# Patient Record
Sex: Male | Born: 1970 | Race: White | Hispanic: No | Marital: Married | Smoking: Current some day smoker
Health system: Southern US, Community
[De-identification: ages and names within clinical notes are randomized; demographics above are authoritative.]

## PROBLEM LIST (undated history)

## (undated) DIAGNOSIS — I728 Aneurysm of other specified arteries: Secondary | ICD-10-CM

## (undated) DIAGNOSIS — I10 Essential (primary) hypertension: Secondary | ICD-10-CM

## (undated) DIAGNOSIS — K219 Gastro-esophageal reflux disease without esophagitis: Secondary | ICD-10-CM

## (undated) DIAGNOSIS — S36039A Unspecified laceration of spleen, initial encounter: Secondary | ICD-10-CM

## (undated) HISTORY — PX: OTHER SURGICAL HISTORY: SHX169

## (undated) HISTORY — PX: KNEE ARTHROSCOPY: SHX127

## (undated) HISTORY — PX: TONSILLECTOMY: SUR1361

---

## 2000-05-24 ENCOUNTER — Emergency Department (HOSPITAL_COMMUNITY): Admission: EM | Admit: 2000-05-24 | Discharge: 2000-05-24 | Payer: Self-pay | Admitting: Emergency Medicine

## 2000-06-02 ENCOUNTER — Other Ambulatory Visit: Admission: RE | Admit: 2000-06-02 | Discharge: 2000-06-02 | Payer: Self-pay | Admitting: Orthopedic Surgery

## 2000-06-20 ENCOUNTER — Encounter (HOSPITAL_COMMUNITY): Admission: RE | Admit: 2000-06-20 | Discharge: 2000-07-20 | Payer: Self-pay | Admitting: Orthopedic Surgery

## 2000-07-25 ENCOUNTER — Encounter (HOSPITAL_COMMUNITY): Admission: RE | Admit: 2000-07-25 | Discharge: 2000-08-24 | Payer: Self-pay | Admitting: Orthopedic Surgery

## 2000-12-28 ENCOUNTER — Encounter: Payer: Self-pay | Admitting: Orthopedic Surgery

## 2000-12-28 ENCOUNTER — Ambulatory Visit (HOSPITAL_COMMUNITY): Admission: RE | Admit: 2000-12-28 | Discharge: 2000-12-28 | Payer: Self-pay | Admitting: Orthopedic Surgery

## 2011-02-14 ENCOUNTER — Ambulatory Visit: Payer: Self-pay

## 2011-11-04 ENCOUNTER — Ambulatory Visit: Payer: Self-pay | Admitting: Orthopedic Surgery

## 2014-01-02 ENCOUNTER — Inpatient Hospital Stay: Payer: Self-pay | Admitting: Internal Medicine

## 2014-01-02 ENCOUNTER — Ambulatory Visit: Payer: Self-pay | Admitting: Internal Medicine

## 2014-01-02 LAB — COMPREHENSIVE METABOLIC PANEL
Albumin: 3.8 g/dL (ref 3.4–5.0)
Alkaline Phosphatase: 121 U/L — ABNORMAL HIGH
Anion Gap: 7 (ref 7–16)
BUN: 7 mg/dL (ref 7–18)
Bilirubin,Total: 1 mg/dL (ref 0.2–1.0)
Calcium, Total: 8.4 mg/dL — ABNORMAL LOW (ref 8.5–10.1)
Chloride: 104 mmol/L (ref 98–107)
Co2: 26 mmol/L (ref 21–32)
Creatinine: 0.99 mg/dL (ref 0.60–1.30)
EGFR (African American): >60
EGFR (Non-African Amer.): >60
Glucose: 107 mg/dL — ABNORMAL HIGH (ref 65–99)
Osmolality: 272 (ref 275–301)
Potassium: 3.4 mmol/L — ABNORMAL LOW (ref 3.5–5.1)
SGOT(AST): 23 U/L (ref 15–37)
SGPT (ALT): 55 U/L
Sodium: 137 mmol/L (ref 136–145)
Total Protein: 7.3 g/dL (ref 6.4–8.2)

## 2014-01-02 LAB — CBC
HCT: 43.7 % (ref 40.0–52.0)
HGB: 14.9 g/dL (ref 13.0–18.0)
MCH: 30.9 pg (ref 26.0–34.0)
MCHC: 34.2 g/dL (ref 32.0–36.0)
MCV: 91 fL (ref 80–100)
Platelet: 217 10*3/uL (ref 150–440)
RBC: 4.82 10*6/uL (ref 4.40–5.90)
RDW: 12.7 % (ref 11.5–14.5)
WBC: 12.9 10*3/uL — ABNORMAL HIGH (ref 3.8–10.6)

## 2014-01-02 LAB — APTT: Activated PTT: 28.2 secs (ref 23.6–35.9)

## 2014-01-02 LAB — LIPASE, BLOOD: Lipase: 155 U/L (ref 73–393)

## 2014-01-02 LAB — URINALYSIS, COMPLETE
Bacteria: NONE SEEN
Bilirubin,UR: NEGATIVE
Blood: NEGATIVE
Glucose,UR: NEGATIVE mg/dL (ref 0–75)
Ketone: NEGATIVE
Leukocyte Esterase: NEGATIVE
Nitrite: NEGATIVE
Ph: 7 (ref 4.5–8.0)
Protein: NEGATIVE
RBC,UR: <1 /HPF (ref 0–5)
Specific Gravity: 1.03 (ref 1.003–1.030)
Squamous Epithelial: NONE SEEN
WBC UR: NONE SEEN /HPF (ref 0–5)

## 2014-01-02 LAB — PROTIME-INR
INR: 1
Prothrombin Time: 12.9 secs (ref 11.5–14.7)

## 2014-01-03 LAB — LIPID PANEL
Cholesterol: 141 mg/dL (ref 0–200)
HDL Cholesterol: 25 mg/dL — ABNORMAL LOW (ref 40–60)
Ldl Cholesterol, Calc: 82 mg/dL (ref 0–100)
Triglycerides: 168 mg/dL (ref 0–200)
VLDL Cholesterol, Calc: 34 mg/dL (ref 5–40)

## 2014-01-03 LAB — CBC WITH DIFFERENTIAL/PLATELET
Basophil #: 0 10*3/uL (ref 0.0–0.1)
Basophil %: 0.2 %
Eosinophil #: 0.1 10*3/uL (ref 0.0–0.7)
Eosinophil %: 1.3 %
HCT: 39.6 % — ABNORMAL LOW (ref 40.0–52.0)
HGB: 13.2 g/dL (ref 13.0–18.0)
Lymphocyte #: 1.6 10*3/uL (ref 1.0–3.6)
Lymphocyte %: 18.3 %
MCH: 30.7 pg (ref 26.0–34.0)
MCHC: 33.3 g/dL (ref 32.0–36.0)
MCV: 92 fL (ref 80–100)
Monocyte #: 0.8 x10 3/mm (ref 0.2–1.0)
Monocyte %: 9.5 %
Neutrophil #: 6.1 10*3/uL (ref 1.4–6.5)
Neutrophil %: 70.7 %
Platelet: 191 10*3/uL (ref 150–440)
RBC: 4.29 10*6/uL — ABNORMAL LOW (ref 4.40–5.90)
RDW: 12.7 % (ref 11.5–14.5)
WBC: 8.7 10*3/uL (ref 3.8–10.6)

## 2014-01-03 LAB — BASIC METABOLIC PANEL
Anion Gap: 7 (ref 7–16)
BUN: 10 mg/dL (ref 7–18)
Calcium, Total: 8.2 mg/dL — ABNORMAL LOW (ref 8.5–10.1)
Chloride: 107 mmol/L (ref 98–107)
Co2: 28 mmol/L (ref 21–32)
Creatinine: 1.18 mg/dL (ref 0.60–1.30)
EGFR (African American): >60
EGFR (Non-African Amer.): >60
Glucose: 109 mg/dL — ABNORMAL HIGH (ref 65–99)
Osmolality: 283 (ref 275–301)
Potassium: 3.5 mmol/L (ref 3.5–5.1)
Sodium: 142 mmol/L (ref 136–145)

## 2014-01-03 LAB — HEMOGLOBIN: HGB: 14.1 g/dL (ref 13.0–18.0)

## 2014-01-04 LAB — BASIC METABOLIC PANEL
Anion Gap: 7 (ref 7–16)
BUN: 10 mg/dL (ref 7–18)
CREATININE: 1.06 mg/dL (ref 0.60–1.30)
Calcium, Total: 8.1 mg/dL — ABNORMAL LOW (ref 8.5–10.1)
Chloride: 105 mmol/L (ref 98–107)
Co2: 30 mmol/L (ref 21–32)
EGFR (African American): >60
EGFR (Non-African Amer.): >60
Glucose: 98 mg/dL (ref 65–99)
Osmolality: 282 (ref 275–301)
Potassium: 3.6 mmol/L (ref 3.5–5.1)
SODIUM: 142 mmol/L (ref 136–145)

## 2014-01-04 LAB — CBC WITH DIFFERENTIAL/PLATELET
Basophil #: 0 10*3/uL (ref 0.0–0.1)
Basophil %: 0.4 %
EOS ABS: 0.2 10*3/uL (ref 0.0–0.7)
Eosinophil %: 2.2 %
HCT: 40 % (ref 40.0–52.0)
HGB: 13.7 g/dL (ref 13.0–18.0)
LYMPHS ABS: 2.2 10*3/uL (ref 1.0–3.6)
Lymphocyte %: 20.9 %
MCH: 31.2 pg (ref 26.0–34.0)
MCHC: 34.2 g/dL (ref 32.0–36.0)
MCV: 91 fL (ref 80–100)
Monocyte #: 1 x10 3/mm (ref 0.2–1.0)
Monocyte %: 9.8 %
NEUTROS ABS: 6.9 10*3/uL — AB (ref 1.4–6.5)
Neutrophil %: 66.7 %
PLATELETS: 194 10*3/uL (ref 150–440)
RBC: 4.39 10*6/uL — ABNORMAL LOW (ref 4.40–5.90)
RDW: 12.8 % (ref 11.5–14.5)
WBC: 10.4 10*3/uL (ref 3.8–10.6)

## 2014-01-05 LAB — CBC WITH DIFFERENTIAL/PLATELET
BASOS ABS: 0 10*3/uL (ref 0.0–0.1)
BASOS PCT: 0.3 %
Eosinophil #: 0.3 10*3/uL (ref 0.0–0.7)
Eosinophil %: 2.8 %
HCT: 40.8 % (ref 40.0–52.0)
HGB: 13.9 g/dL (ref 13.0–18.0)
LYMPHS ABS: 1.8 10*3/uL (ref 1.0–3.6)
LYMPHS PCT: 17.6 %
MCH: 31.3 pg (ref 26.0–34.0)
MCHC: 34.1 g/dL (ref 32.0–36.0)
MCV: 92 fL (ref 80–100)
Monocyte #: 0.9 x10 3/mm (ref 0.2–1.0)
Monocyte %: 8.7 %
NEUTROS PCT: 70.6 %
Neutrophil #: 7.1 10*3/uL — ABNORMAL HIGH (ref 1.4–6.5)
PLATELETS: 212 10*3/uL (ref 150–440)
RBC: 4.45 10*6/uL (ref 4.40–5.90)
RDW: 12.6 % (ref 11.5–14.5)
WBC: 10.1 10*3/uL (ref 3.8–10.6)

## 2014-01-06 LAB — HEMOGLOBIN: HGB: 14.7 g/dL (ref 13.0–18.0)

## 2014-01-07 LAB — COMPREHENSIVE METABOLIC PANEL
ALK PHOS: 109 U/L
Albumin: 3.3 g/dL — ABNORMAL LOW (ref 3.4–5.0)
Anion Gap: 5 — ABNORMAL LOW (ref 7–16)
BILIRUBIN TOTAL: 1.8 mg/dL — AB (ref 0.2–1.0)
BUN: 15 mg/dL (ref 7–18)
CALCIUM: 8.6 mg/dL (ref 8.5–10.1)
CREATININE: 1.32 mg/dL — AB (ref 0.60–1.30)
Chloride: 105 mmol/L (ref 98–107)
Co2: 27 mmol/L (ref 21–32)
EGFR (African American): >60
GLUCOSE: 114 mg/dL — AB (ref 65–99)
OSMOLALITY: 276 (ref 275–301)
Potassium: 4.2 mmol/L (ref 3.5–5.1)
SGOT(AST): 19 U/L (ref 15–37)
SGPT (ALT): 27 U/L
SODIUM: 137 mmol/L (ref 136–145)
Total Protein: 7.5 g/dL (ref 6.4–8.2)

## 2014-01-07 LAB — CBC WITH DIFFERENTIAL/PLATELET
BASOS ABS: 0 10*3/uL (ref 0.0–0.1)
Basophil %: 0.3 %
EOS ABS: 0.1 10*3/uL (ref 0.0–0.7)
EOS PCT: 0.5 %
HCT: 41.8 % (ref 40.0–52.0)
HGB: 14 g/dL (ref 13.0–18.0)
LYMPHS PCT: 12.8 %
Lymphocyte #: 1.7 10*3/uL (ref 1.0–3.6)
MCH: 30.8 pg (ref 26.0–34.0)
MCHC: 33.6 g/dL (ref 32.0–36.0)
MCV: 92 fL (ref 80–100)
Monocyte #: 1.2 x10 3/mm — ABNORMAL HIGH (ref 0.2–1.0)
Monocyte %: 9.2 %
Neutrophil #: 10.2 10*3/uL — ABNORMAL HIGH (ref 1.4–6.5)
Neutrophil %: 77.2 %
Platelet: 244 10*3/uL (ref 150–440)
RBC: 4.56 10*6/uL (ref 4.40–5.90)
RDW: 12.4 % (ref 11.5–14.5)
WBC: 13.3 10*3/uL — ABNORMAL HIGH (ref 3.8–10.6)

## 2014-01-08 LAB — BASIC METABOLIC PANEL
ANION GAP: 8 (ref 7–16)
BUN: 14 mg/dL (ref 7–18)
CALCIUM: 8.7 mg/dL (ref 8.5–10.1)
CO2: 27 mmol/L (ref 21–32)
Chloride: 102 mmol/L (ref 98–107)
Creatinine: 1.06 mg/dL (ref 0.60–1.30)
EGFR (African American): >60
EGFR (Non-African Amer.): >60
Glucose: 94 mg/dL (ref 65–99)
Osmolality: 274 (ref 275–301)
Potassium: 3.7 mmol/L (ref 3.5–5.1)
Sodium: 137 mmol/L (ref 136–145)

## 2014-01-08 LAB — CBC WITH DIFFERENTIAL/PLATELET
Basophil #: 0 10*3/uL (ref 0.0–0.1)
Basophil %: 0.3 %
EOS ABS: 0.1 10*3/uL (ref 0.0–0.7)
EOS PCT: 1.1 %
HCT: 41.2 % (ref 40.0–52.0)
HGB: 14 g/dL (ref 13.0–18.0)
LYMPHS PCT: 14.6 %
Lymphocyte #: 1.7 10*3/uL (ref 1.0–3.6)
MCH: 30.9 pg (ref 26.0–34.0)
MCHC: 34.1 g/dL (ref 32.0–36.0)
MCV: 91 fL (ref 80–100)
Monocyte #: 1.3 x10 3/mm — ABNORMAL HIGH (ref 0.2–1.0)
Monocyte %: 11.2 %
NEUTROS ABS: 8.4 10*3/uL — AB (ref 1.4–6.5)
Neutrophil %: 72.8 %
PLATELETS: 231 10*3/uL (ref 150–440)
RBC: 4.54 10*6/uL (ref 4.40–5.90)
RDW: 12.5 % (ref 11.5–14.5)
WBC: 11.6 10*3/uL — ABNORMAL HIGH (ref 3.8–10.6)

## 2014-07-02 NOTE — Op Note (Signed)
PATIENT NAME:  Mitchell Butler, Mitchell Butler MR#:  [id number] DATE OF BIRTH:  April 13, 1970  DATE OF PROCEDURE:  11/04/2011  PREOPERATIVE DIAGNOSIS: Right knee plica band.  POSTOPERATIVE DIAGNOSES: Right knee plica band with loose body and lateral meniscus tear.   PROCEDURES:  1. Arthroscopy right knee. 2. Excision plica band. 3. Removal of loose body. 4. Partial lateral meniscectomy.   SURGEON: Leitha SchullerMichael J. Gemini Beaumier, MD  ANESTHESIA: General.    DESCRIPTION OF PROCEDURE: Patient was brought to the Operating Room and after adequate anesthesia was obtained the leg was placed in the arthroscopic legholder and then prepped and draped in the usual sterile fashion. After appropriate patient identification and timeout procedures were carried out after having prepped and draped an inferolateral portal was made and the arthroscope was introduced. Initial inspection revealed a superior plica band, mild patellofemoral degenerative change. Coming around medially there was a very thick medial plica as well running between the patella and the medial aspect of the capsule and coming along the medial aspect of the femoral condyle there was a mass, loose body partly connected to the synovium approximately 1 x 2 cm in size. The medial compartment was relatively normal, just some very mild degenerative changes. Meniscus intact after probing through an inferomedial portal. The anterior cruciate ligament was intact. The lateral compartment had some areas of partial thickness articular cartilage loss and a very large meniscus tear involving the anterior and lateral thirds. This appeared to be chronic with significant degenerative changes to the tear. Approximately a third of the anterior and middle third of the meniscus were removed with use of a shaver at this point as well as ArthroCare wand to smooth the edges. Next, the shaver was used to debride the loose body in the medial gutter. Following this the medial capsule and plica were debrided  until there was no longer palpable mass on the medial aspect of the knee. The gutters were checked and after thorough irrigation of the knee all instrumentation was withdrawn. Sterile dressings with Xeroform, 4 x 4, Webril, and Ace wrap were applied after infiltrating the knee with 20 mL of 0.5% Sensorcaine with epinephrine.   COMPLICATIONS: There were no complications.   SPECIMEN: No specimen.   CONDITION: To recovery room stable.   ____________________________ Leitha SchullerMichael J. Alonzo Owczarzak, MD mjm:cms D: 11/04/2011 21:00:44 ET T: 11/05/2011 08:50:49 ET JOB#: [id number]  cc: Leitha SchullerMichael J. Juriel Cid, MD, <Dictator> Leitha SchullerMICHAEL J Jazlyn Tippens MD ELECTRONICALLY SIGNED 11/05/2011 9:59

## 2014-07-06 NOTE — Consult Note (Signed)
General Aspect The patient is a 44 yo male who was seen by Dr Loleta Chance earlier today for evaluation of abdominal pain.  the pain was abrupt in onset this am and occured as he was getting out of his [employer] at work.  No prior similar episoded.  He denies trauma.  He denies a past diagnosis of hypertension but states that he has been hypertensive for a long time.  CT scan shows a small perisplenic hematoma which I agree with but the report notes a splenic artery dissection but I do not appreciate any such lesion    No Known Allergies:   Case History:  Family History Non-Contributory   Social History positive  tobacco (Current within 1 year), occasional use of a pipe   Review of Systems:  Fever/Chills No   Cough No   Sputum No   Abdominal Pain Yes   Diarrhea No   Constipation No   Nausea/Vomiting No   SOB/DOE No   Chest Pain No   Telemetry Reviewed NSR   Dysuria No   Physical Exam:  GEN well developed, well nourished, thin   HEENT hearing intact to voice, moist oral mucosa   NECK supple  trachea midline   RESP normal resp effort  no use of accessory muscles   CARD regular rate  no JVD   ABD positive tenderness  nondistended   EXTR negative cyanosis/clubbing, negative edema   SKIN No rashes, No ulcers   NEURO cranial nerves intact, follows commands, motor/sensory function intact   PSYCH alert, A+O to time, place, person, good insight   Hepatic:  21-Oct-15 15:51   Bilirubin, Total 1.0  Alkaline Phosphatase  121 (46-116 NOTE: New Reference Range 10/02/13)  SGPT (ALT) 55 (14-63 NOTE: New Reference Range 10/02/13)  SGOT (AST) 23  Total Protein, Serum 7.3  Albumin, Serum 3.8  Routine BB:  21-Oct-15 16:15   ABO Group + Rh Type A Positive  Antibody Screen NEGATIVE (Result(s) reported on 02 Jan 2014 at 05:05PM.)  Routine Chem:  21-Oct-15 15:51   Lipase 155 (Result(s) reported on 02 Jan 2014 at 04:27PM.)  Glucose, Serum  107  BUN 7  Creatinine (comp)  0.99  Sodium, Serum 137  Potassium, Serum  3.4  Chloride, Serum 104  CO2, Serum 26  Calcium (Total), Serum  8.4  Osmolality (calc) 272  eGFR (African American) >60  eGFR (Non-African American) >60 (eGFR values <23m/min/1.73 m2 may be an indication of chronic kidney disease (CKD). Calculated eGFR, using the MRDR Study equation, is useful in  patients with stable renal function. The eGFR calculation will not be reliable in acutely ill patients when serum creatinine is changing rapidly. It is not useful in patients on dialysis. The eGFR calculation may not be applicable to patients at the low and high extremes of body sizes, pregnant women, and vetetarians.)  Anion Gap 7  Routine Coag:  21-Oct-15 15:51   Prothrombin 12.9  INR 1.0 (INR reference interval applies to patients on anticoagulant therapy. A single INR therapeutic range for coumarins is not optimal for all indications; however, the suggested range for most indications is 2.0 - 3.0. Exceptions to the INR Reference Range may include: Prosthetic heart valves, acute myocardial infarction, prevention of myocardial infarction, and combinations of aspirin and anticoagulant. The need for a higher or lower target INR must be assessed individually. Reference: The Pharmacology and Management of the Vitamin K  antagonists: the seventh ACCP Conference on Antithrombotic and Thrombolytic Therapy. CJEHUD.1497Sept:126 (3suppl): [id number]  A HCT value >55% may artifactually increase the PT.  In one study,  the increase was an average of 25%. Reference:  "Effect on Routine and Special Coagulation Testing Values of Citrate Anticoagulant Adjustment in Patients with High HCT Values." American Journal of Clinical Pathology 2006;126:400-405.)  Activated PTT (APTT) 28.2 (A HCT value >55% may artifactually increase the APTT. In one study, the increase was an average of 19%. Reference: "Effect on Routine and Special Coagulation Testing Values of  Citrate Anticoagulant Adjustment in Patients with High HCT Values." American Journal of Clinical Pathology 2006;126:400-405.)  Routine Hem:  21-Oct-15 15:51   WBC (CBC)  12.9  RBC (CBC) 4.82  Hemoglobin (CBC) 14.9  Hematocrit (CBC) 43.7  Platelet Count (CBC) 217 (Result(s) reported on 02 Jan 2014 at 04:24PM.)  MCV 91  MCH 30.9  MCHC 34.2  RDW 12.7    Impression 1.  Perisplenic hematoma  patient is being admitted to telemetry and will have serial Hgb checked.  No angiogram at this time but if he has increasing symptoms or a steadily falling hemoglobin then angiography will be performed.  I would treat this as a uncompicated splenic laceration or spontaneous retroperitoneal hematoma. 2.  Hypertension with dialstolic reading as high as 120 (observed while I was in the room)  controll of his blood pressure is key and the most likely causitive agent in the hematoma.  Medicine is adressing this  3.  Hypokalemia  plan to replete K+ 4.  Pulmonary nodule  given his tobacco use agree with repeat CT scan in 3 months   Plan level 4 consult   Electronic Signatures: Hortencia Pilar (MD)  (Signed 21-Oct-15 18:33)  Authored: General Aspect/Present Illness, Allergies, History and Physical Exam, Labs, Impression/Plan   Last Updated: 21-Oct-15 18:33 by Hortencia Pilar (MD)

## 2014-07-06 NOTE — Discharge Summary (Signed)
PATIENT NAME:  Mitchell Butler, Mitchell Butler MR#:  [id number] DATE OF BIRTH:  [birth year]  DATE OF ADMISSION:  01/02/2014 DATE OF DISCHARGE:  01/08/2014  DISCHARGE DIAGNOSES:  1.  Spontaneous splenic laceration with perisplenic bleed.  2.  Uncontrolled hypertension.   DISCHARGE MEDICATIONS: Norco 5/325 one to two tabs t.i.d. p.r.n. pain, Toprol-XL 50 mg daily, amlodipine 10 mg daily.   REASON FOR ADMISSION: A 44 year old male who presents with severe abdominal pain. Please see H and P for HPI, past medical history and physical examination.  HOSPITAL COURSE: The patient was admitted. Perisplenic hematoma was noted on CT scan. Follow-up CT showed the question of an aneurysm of the celiac artery and follow-up angiogram was normal. His pain was treated with IV morphine and pain medication. His blood pressure, which was a significant issue, was finally controlled with metoprolol and amlodipine. Lisinopril bumped his creatinine to 1.3 and that was held and his creatinine came back to baseline. He will follow up with Dr. Gilda CreaseSchnier in regard to being out of work and with me for his blood pressure. Overall prognosis is guarded.  ____________________________ Danella PentonMark F. Miller, MD mfm:sb D: 01/08/2014 08:19:12 ET T: 01/08/2014 10:35:52 ET JOB#: [id number]  cc: Danella PentonMark F. Miller, MD, <Dictator> MARK Sherlene ShamsF MILLER MD ELECTRONICALLY SIGNED 01/08/2014 21:09

## 2014-07-06 NOTE — Op Note (Signed)
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   POSTOPERATIVE DIAGNOSES: Normal celiac artery, no evidence of pseudoaneurysm or dissection.   PROCEDURE PERFORMED:  Selective injection celiac artery.   PROCEDURE PERFORMED BY: Renford DillsGregory G Schnier, M.D.   SEDATION: Versed 5 mg plus fentanyl 200 mcg administered IV. Continuous ECG, pulse oximetry, and cardiopulmonary monitoring is performed throughout the entire procedure by the interventional radiology nurse. Total sedation time is 1 hour.   ACCESS: A 5 French sheath, right common femoral artery.   FLUOROSCOPY TIME:  Was 1.6 minutes.   CONTRAST USED: Isovue 90 mL.   INDICATIONS: Mr. Mitchell ButtsWade is a 44 year old gentleman who presented to the hospital with the abrupt onset of left upper quadrant pain. Initial CT scan suggested a perisplenic hematoma associated with the splenic artery dissection. I did not appreciate this dissection on exam and elected to observe the patient and follow serial hemoglobins. Hemoglobins have been stable and prior to his discharge I opted to rescan the individual to assess any changes in the status of his hematoma. Rescan suggested a celiac pseudoaneurysm and narrowing consistent with possible dissection into the origin of the splenic. Risks and benefits based on this information for angiography with the possibility of treating this area was reviewed and discussed in great detail with the patient has family. All questions have been answered. The patient has agreed to proceed.   DESCRIPTION OF PROCEDURE: The patient is taken to special procedures and placed in the supine position. After adequate sedation is achieved, he is positioned supine and his right groin is prepped and draped in sterile fashion. Appropriate timeout is called.   Ultrasound is placed in a sterile sleeve.  Common femoral artery is identified. It is echolucent and pulsatile indicating patency. Image is recorded for the permanent record. Under real-time visualization after 1% lidocaine has been infiltrated microneedle is inserted into the anterior wall of the common femoral artery, microwire followed by micro sheath, J-wire followed by a 5 French sheath, and 5 French pigtail catheter. The pigtail catheter is positioned at the level of T11-T12 and AP projection of the aorta is obtained. Visceral vessels fill rapidly, renal vessels are noted as well, no evidence of renal artery stenosis.   The patient's arms are then raised above his head, a true lateral is obtained. This demonstrates the origins of the SMA, as well as the celiac artery appears normal. There is no evidence of a pseudoaneurysm. SMA appears normal in its proximal portion as well. Using both an LAO projection and AP projection and then RAO projection, magnified images of the celiac are then obtained. After a VS1 catheter is used to select it. These images, again do not demonstrate any evidence of pseudoaneurysm, normal-appearing celiac, no evidence of dissection.   This catheter is then removed and StarClose device deployed after an RAO projection of the groin is obtained. There are no immediate complications.   INTERPRETATION: The abdominal aorta is free of atherosclerotic changes, bilateral nephrograms are noted, normal size kidneys; single renal arteries to each kidney. No evidence of renal artery stenosis. SMA appears patent in its visualized proximal portions; celiac, hepatic and splenic arteries all appear widely patent with rapid filling of contrast on the best imaging using a lateral view. There is no evidence of pseudoaneurysm. There is no evidence of dissection.   SUMMARY: Normal-appearing celiac access with hepatic and  splenic arteries filling rapidly. The patient will be scanned with an ultrasound in approximately 1 month based on this  result. Follow-up CT scan in several months may be warranted.    ____________________________ Renford Dills, MD ggs:nt D: 01/07/2014 09:05:44 ET T: 01/07/2014 15:16:13 ET JOB#: [id number]  cc: Renford Dills, MD, <Dictator> Danella Penton, MD Renford Dills MD ELECTRONICALLY SIGNED 01/16/2014 11:46

## 2014-07-06 NOTE — H&P (Signed)
PATIENT NAME:  Mitchell Butler, Mitchell Butler MR#:  [id number] DATE OF BIRTH:  11/25/1970  PRIMARY CARE PHYSICIAN:  Danella Penton, MD  REFERRING EMERGENCY ROOM PHYSICIAN:  Darien Ramus, MD   VASCULAR SURGEON:  Renford Dills, MD, vascular surgeon.  CHIEF COMPLAINT: Acute left upper quadrant abdominal pain.   HISTORY OF PRESENT ILLNESS: The patient is a 44 year old Caucasian male with no significant past medical history. He started having acute left upper quadrant abdominal pain from 8:30 a.m. today. 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 This happened at 8:30 a.m. and the pain was 10/10. It was radiating to his back. The pain was intense. The patient went back to the truck and rested there for 20 minutes, following which the pain eased off and then he finished his work and went to see his primary care physician, Dr. Bethann Punches, this afternoon.   The patient was sent over to get CAT scan of the abdomen and pelvis with contrast.  The results were called in to [facility] with splenic artery dissection with surrounding hematoma in the left upper quadrant. Dr. Bethann Punches had asked the patient to go to the ED immediately.   In the ED, the patient was evaluated by the vascular surgeon, Dr. Gilda Crease, after notifying him by Dr. Carollee Massed, the ER physician. Dr. Gilda Crease personally came by and has seen the patient and the CAT scan report and he does not think it is a splenic artery dissection. He has recommended to admit the patient to hospitalist service for possible angiogram in the morning. The patient was given IV pain medication and during my examination, his pain was a 2-3. He was never diagnosed with hypertension, but blood pressure was elevated and the systolic blood pressure was running at around 150s-160. Labetalol IV was given in the ED x 2.   During my examination, patient denies any chest  pain, shortness of breath, but complaining of 3-4/10 left upper quadrant abdominal pain radiating to the back. No similar complaints in the past. Denies any dizziness or loss of consciousness. Wife is at bedside.   PAST MEDICAL HISTORY AND PAST SURGICAL HISTORY: Right knee repair, right arm open reduction internal fixation, tonsillectomy. No known diabetes.  PSYCHOSOCIAL HISTORY:  He lives at home with wife. Smokes 3 packs per week. Occasional intake of alcohol. Denies any illicit drug usage.   FAMILY HISTORY: Mom deceased with heart attack at age 47.  HOME MEDICATIONS: Not on any home medications.   REVIEW OF SYSTEMS: CONSTITUTIONAL: Denies any fever, fatigue, weakness. EYES: Denies blurry vision, double vision. Denies any glaucoma.  EARS, NOSE, THROAT: Denies epistaxis, discharge.  RESPIRATION: Denies cough, COPD.   CARDIOVASCULAR: No chest pain, palpitations, syncope.  GASTROINTESTINAL: Denies nausea, vomiting, diarrhea, complaining of left upper quadrant abdominal pain radiating to the left flank.  Denies any hematemesis or melena.  GENITOURINARY: No dysuria or hematuria.  ENDOCRINE: Denies polyuria, nocturia, thyroid problems. HEMATOLOGIC/LYMPHATICS:  No anemia, easy bruising, bleeding.  INTEGUMENTARY: No acne, rash, lesions.  MUSCULOSKELETAL: No joint pain in the neck and back.  Denies any gout.  NEUROLOGIC:  Denies vertigo, ataxia.  PSYCHIATRIC: No ADD, OCD.   PHYSICAL EXAMINATION:  VITAL SIGNS: Temperature 98 degrees Fahrenheit, pulse 72, respirations 20, blood pressure is 150/107, pulse oximetry is 97%.  GENERAL APPEARANCE: Not in any acute distress.  HEENT: Normocephalic, atraumatic. Pupils are equal, reacting to light  and accommodation. No scleral icterus. No conjunctival injection. No sinus tenderness. No postnasal drip. Moist mucous membranes.  NECK: Supple. No JVD. No thyromegaly. Range of motion is intact.  LUNGS: Clear to auscultation bilaterally. No accessory muscle use  and no anterior chest wall tenderness on palpation.  CARDIAC: S1, S2 normal. Regular rate and rhythm. No murmurs.  GASTROINTESTINAL: Soft. Bowel sounds are positive in all 4 quadrants. Positive left upper quadrant tenderness and left flank tenderness, but no rebound tenderness. Also, left lower quadrant is tender. NEUROLOGIC:  Awake, alert, and oriented x 3. Motor and sensory are grossly intact. Reflexes are 2+.  EXTREMITIES: No edema. No cyanosis. No clubbing.  SKIN: Warm and dry.  There is normal turgor. No rashes. No lesions.  MUSCULOSKELETAL: No joint effusion, tenderness, erythema.  PSYCHIATRIC: Normal mood and affect.   LABORATORY AND IMAGING STUDIES: CAT scan of the abdomen and pelvis with contrast has revealed splenic artery dissection with surrounding hematoma in the left upper quadrant. No active contrast extravasation identified. Positive hepatic steatosis.  An  8 mm nodule in the right minor fissure. The patient is at high risk for bronchogenic carcinoma. Followup CT chest in 3-6 months is recommended. If low risk, a 6-12 month followup is recommended. LFTs are normal except alkaline phosphatase at 121, WBC of 12.9. The rest of the CBC is normal. PT-INR and activated PTT is normal. BMP, glucose 107, potassium 3.4, calcium 8.4. The rest of the BMP is normal.   ASSESSMENT AND PLAN: A 44 year old Caucasian male who came in to the Emergency Department with acute left upper quadrant abdominal pain. He was seen by his primary care physician. Stat CAT scan of the abdomen and pelvis has revealed splenic artery dissection and hematoma.  The patient was evaluated by vascular surgeon, Dr. Gilda CreaseSchnier, who does not think the patient has any dissection and recommended to admit the patient for possible angiogram in the morning.   1.  Acute left upper quadrant abdominal pain secondary to splenic hematoma. Etiology is unclear. We will admit him to off unit telemetry. We will provide pain management with  Percocet and morphine. Consult is placed to vascular surgeon, Dr. Gilda CreaseSchnier. The patient was already seen by him.  He is considering angiogram in morning possibly. 2.  New diagnosis of hypertension. Start him on metoprolol and we will provide intravenous  Lopressor as needed basis to maintain his systolic blood pressure between 120-140.  3.  Hypokalemia. Will replace.  4.  Pulmonary nodule.  Radiologist is recommending repeat CAT scan of the chest in 3-6 months if he is at high risk for bronchogenic carcinoma and in 6-12 months if he is at low risk for bronchogenic carcinoma. PCP to follow up on that.  5.  Nicotine abuse. The patient was counseled to quit smoking. We will provide nicotine patch. 6.  Will provide gastrointestinal prophylaxis with ranitidine and deep vein thrombosis prophylaxis with sequential compression devices.  Full code.  Wife is the medical power of attorney. Plan of care discussed in detail with the patient and his wife at bedside. They both verbalized understanding of the plan. Total time spent is 40 minutes. The patient will be transferred to Dr. Bethann PunchesMark Miller today.   ____________________________ Ramonita LabAruna Cordell Guercio, MD ag:LT D: 01/02/2014 17:50:33 ET T: 01/02/2014 18:16:47 ET JOB#: [id number]  cc: Ramonita LabAruna Denny Mccree, MD, <Dictator> Danella PentonMark F. Miller, MD Renford DillsGregory G. Schnier, MD  Ramonita LabARUNA Kenyotta Dorfman MD ELECTRONICALLY SIGNED 01/30/2014 14:06

## 2014-07-31 ENCOUNTER — Other Ambulatory Visit: Payer: Self-pay

## 2014-07-31 ENCOUNTER — Encounter: Payer: Self-pay | Admitting: *Deleted

## 2014-07-31 ENCOUNTER — Emergency Department: Payer: No Typology Code available for payment source

## 2014-07-31 DIAGNOSIS — Z720 Tobacco use: Secondary | ICD-10-CM | POA: Insufficient documentation

## 2014-07-31 DIAGNOSIS — R0789 Other chest pain: Secondary | ICD-10-CM | POA: Diagnosis present

## 2014-07-31 DIAGNOSIS — I10 Essential (primary) hypertension: Secondary | ICD-10-CM | POA: Insufficient documentation

## 2014-07-31 DIAGNOSIS — I728 Aneurysm of other specified arteries: Secondary | ICD-10-CM | POA: Insufficient documentation

## 2014-07-31 DIAGNOSIS — R197 Diarrhea, unspecified: Secondary | ICD-10-CM | POA: Insufficient documentation

## 2014-07-31 DIAGNOSIS — R1110 Vomiting, unspecified: Secondary | ICD-10-CM | POA: Insufficient documentation

## 2014-07-31 DIAGNOSIS — R109 Unspecified abdominal pain: Secondary | ICD-10-CM | POA: Diagnosis not present

## 2014-07-31 LAB — CBC
HEMATOCRIT: 44.4 % (ref 40.0–52.0)
HEMOGLOBIN: 15.2 g/dL (ref 13.0–18.0)
MCH: 30.7 pg (ref 26.0–34.0)
MCHC: 34.3 g/dL (ref 32.0–36.0)
MCV: 89.7 fL (ref 80.0–100.0)
PLATELETS: 187 10*3/uL (ref 150–440)
RBC: 4.96 MIL/uL (ref 4.40–5.90)
RDW: 12.9 % (ref 11.5–14.5)
WBC: 6.6 10*3/uL (ref 3.8–10.6)

## 2014-07-31 LAB — BRAIN NATRIURETIC PEPTIDE: B NATRIURETIC PEPTIDE 5: 6 pg/mL (ref 0.0–100.0)

## 2014-07-31 LAB — BASIC METABOLIC PANEL
ANION GAP: 10 (ref 5–15)
BUN: 11 mg/dL (ref 6–20)
CALCIUM: 9.3 mg/dL (ref 8.9–10.3)
CO2: 24 mmol/L (ref 22–32)
CREATININE: 0.89 mg/dL (ref 0.61–1.24)
Chloride: 107 mmol/L (ref 101–111)
GFR calc non Af Amer: >60 mL/min (ref >60)
Glucose, Bld: 139 mg/dL — ABNORMAL HIGH (ref 65–99)
Potassium: 3.3 mmol/L — ABNORMAL LOW (ref 3.5–5.1)
Sodium: 141 mmol/L (ref 135–145)

## 2014-07-31 LAB — TROPONIN I: Troponin I: <0.03 ng/mL (ref <0.031)

## 2014-07-31 NOTE — ED Notes (Addendum)
Pt has chest pain for 1 hour.  States i feel like i can't get my breath.   Nonradiating pain.  No cough.  Pipe smoker.  Pt had episode of weakness and shaking of legs.  Skin warm and dry.  Alert.  Speech clear.

## 2014-08-01 ENCOUNTER — Encounter: Payer: Self-pay | Admitting: Emergency Medicine

## 2014-08-01 ENCOUNTER — Emergency Department
Admission: EM | Admit: 2014-08-01 | Discharge: 2014-08-01 | Disposition: A | Discharge status: Home / Self Care | Payer: No Typology Code available for payment source | Attending: Emergency Medicine | Admitting: Emergency Medicine

## 2014-08-01 ENCOUNTER — Emergency Department: Payer: No Typology Code available for payment source

## 2014-08-01 DIAGNOSIS — I728 Aneurysm of other specified arteries: Secondary | ICD-10-CM

## 2014-08-01 HISTORY — DX: Aneurysm of other specified arteries: I72.8

## 2014-08-01 HISTORY — DX: Essential (primary) hypertension: I10

## 2014-08-01 HISTORY — DX: Unspecified laceration of spleen, initial encounter: S36.039A

## 2014-08-01 LAB — TROPONIN I: Troponin I: <0.03 ng/mL (ref <0.031)

## 2014-08-01 LAB — FIBRIN DERIVATIVES D-DIMER (ARMC ONLY): Fibrin derivatives D-dimer (ARMC): 119 (ref 0–499)

## 2014-08-01 MED ORDER — IOHEXOL 350 MG/ML SOLN
100.0000 mL | Freq: Once | INTRAVENOUS | Status: AC | PRN
  Administered 2014-08-01: 100 mL via INTRAVENOUS

## 2014-08-01 NOTE — ED Notes (Signed)
Pt back from CT

## 2014-08-01 NOTE — Discharge Instructions (Signed)
Pseudoaneurysm °A pseudoaneurysm occurs when there is injury to an artery. The injury allows blood to leak out of the artery. The leaking blood collects or pools and is contained in the nearby tissues. °CAUSES  °The most common cause for a pseudoaneurysm is a procedure such as angiography. After angiography, if the insertion site does not fully seal, blood may leak out of the artery. Other causes include: °· Trauma to the walls of an artery. °· Bypass artery grafting surgery. °· An infection affecting the walls of an artery. °· A heart attack (myocardial infarction). °· The rupture of an aneurysm. °SYMPTOMS  °Symptoms may include: °· Pain, discomfort, or tenderness at the angiography insertion site or site of trauma or surgery. °· Swelling at the angiography insertion site or site of trauma or surgery. °· Bruising or discoloration at the angiography insertion site or site of trauma or surgery. °· A pulsing mass at the angiography insertion site or site of trauma or surgery. °DIAGNOSIS  °Doppler ultrasonography is used to diagnose the pseudoaneurysm. This imaging exam shows the blood flow in the arteries and pseudoaneurysm. °TREATMENT °A pseudoaneurysm may resolve without treatment. To prevent uncontrolled bleeding or other problems, your health care provider may recommend one of the following treatments: °· Injecting a blood-clotting enzyme, such as thrombin, into the area. °· Repairing the artery with surgery. °· Applying pressure (compression) to the pseudoaneurysm. °HOME CARE INSTRUCTIONS °· Only take over-the-counter or prescription medicines for pain or discomfort as directed by your health care provider. °· Take your prescribed medicine as directed by your health care provider. You may need to keep taking blood thinners if they were prescribed for you. °· Follow your health care provider's advice for physical activity. °· Keep all follow-up appointments as directed by your health care provider. °SEEK MEDICAL  CARE IF: °· Your pain, discomfort, or tenderness at the angiography insertion site or site of trauma or surgery increases. °· The swelling at the angiography insertion site or site of trauma or surgery increases. °SEEK IMMEDIATE MEDICAL CARE IF:  °· You have severe or persistent pain at the angiography insertion site or site of trauma or surgery. °· There is bleeding or drainage from the angiography insertion site or site of trauma or surgery. °· The extremity used as the angiography insertion site or site of trauma or surgery becomes painful, discolored, cold, or numb. °· You develop chest pain or shortness of breath, feel faint, or pass out. °MAKE SURE YOU: °· Understand these instructions. °· Will watch your condition. °· Will get help right away if you are not doing well or get worse. °Document Released: 08/18/2007 Document Revised: 03/06/2013 Document Reviewed: 10/09/2012 °Exit[employer]® Patient Information ©2015 [employer]. This information is not intended to replace advice given to you by your health care provider. Make sure you discuss any questions you have with your health care provider. ° °

## 2014-08-01 NOTE — ED Notes (Signed)
Pt verbalizes the importance to follow with Dr. Gilda CreaseSchnier today at his office for follow up care.

## 2014-08-01 NOTE — ED Notes (Signed)
Patient transported to CT 

## 2014-08-01 NOTE — ED Provider Notes (Signed)
[facility] Emergency Department Provider Note  ____________________________________________  Time seen: 2:40 AM  I have reviewed the triage vital signs and the nursing notes.   HISTORY  Chief Complaint Chest Pain      HPI Steele SizerDarrell W Butler is a 44 y.o. 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     Past Medical History  Diagnosis Date  . Hypertension   . Celiac artery aneurysm   . Splenic laceration     There are no active problems to display for this patient.   Past Surgical History  Procedure Laterality Date  . Arm sugery      No current outpatient prescriptions on file.  Allergies Review of patient's allergies indicates no known allergies.  No family history on file.  Social History History  Substance Use Topics  . Smoking status: Current Some Day Smoker  . Smokeless tobacco: Not on file  . Alcohol Use: Yes    Review of Systems  Constitutional: Negative for fever. Eyes: Negative for visual changes. ENT: Negative for sore throat. Cardiovascular: Negative for chest pain. Respiratory: Negative for shortness of breath. Gastrointestinal: Positive for abdominal pain, vomiting and diarrhea. Genitourinary: Negative for dysuria. Musculoskeletal: Negative for back pain. Skin: Negative for rash. Neurological: Negative for headaches, focal weakness or numbness.   10-point ROS otherwise negative.  ____________________________________________   PHYSICAL EXAM:  VITAL SIGNS: ED Triage Vitals  Enc Vitals Group     BP 07/31/14 2218 149/98 mmHg     Pulse Rate 08/01/14 0240 71     Resp 08/01/14 0240 20     Temp 08/01/14 0240 97.8 F (36.6 C)     Temp Source 07/31/14 2218 Oral     SpO2 07/31/14 2218 99 %     Weight 07/31/14 2218 230 lb (104.327 kg)     Height 07/31/14 2218 6\' 2"  (1.88 m)     Head Cir --      Peak Flow --      Pain Score --      Pain Loc --      Pain Edu? --      Excl. in GC? --      Constitutional: Alert and oriented. Well appearing and in no distress. Eyes: Conjunctivae are normal. PERRL. Normal extraocular movements. ENT   Head: Normocephalic and atraumatic.   Nose: No congestion/rhinnorhea.   Mouth/Throat: Mucous membranes are moist.   Neck: No stridor. Hematological/Lymphatic/Immunilogical: No cervical lymphadenopathy. Cardiovascular: Normal rate, regular rhythm. Normal and symmetric distal pulses are present in all extremities. No murmurs, rubs, or gallops. Respiratory: Normal respiratory effort without tachypnea nor retractions. Breath sounds are clear and equal bilaterally. No wheezes/rales/rhonchi. Gastrointestinal: Soft and nontender. No distention. There is no CVA tenderness. Genitourinary: deferred Musculoskeletal: Nontender with normal range of motion in all extremities. No joint effusions.  No lower extremity tenderness nor edema. Neurologic:  Normal speech and language. No gross focal neurologic deficits are appreciated. Speech is normal.  Skin:  Skin is warm, dry and intact. No rash noted. Psychiatric: Mood and affect are normal. Speech and behavior are normal. Patient exhibits appropriate insight and judgment.  ____________________________________________    LABS (pertinent positives/negatives)  Labs Reviewed  BASIC METABOLIC PANEL - Abnormal; Notable for the following:    Potassium 3.3 (*)    Glucose, Bld 139 (*)  All other components within normal limits  CBC  TROPONIN I  BRAIN NATRIURETIC PEPTIDE  TROPONIN I  FIBRIN DERIVATIVES D-DIMER [facility])     ____________________________________________   EKG   Date: 08/01/2014  Rate: 88  Rhythm: normal sinus rhythm  QRS Axis: normal  Intervals: normal  ST/T Wave abnormalities: normal  Conduction Disutrbances: none   Narrative Interpretation: unremarkable      ____________________________________________    RADIOLOGY  CT scan discussed with radiologist who noted pseudoaneurysm of the celiac artery.     INITIAL IMPRESSION / ASSESSMENT AND PLAN / ED COURSE  Pertinent labs & imaging results that were available during my care of the patient were reviewed by me and considered in my medical decision making (see chart for details).  History of physical exam consistent with pseudoaneurysm of the celiac artery. Findings were discussed with Dr. Gilda CreaseSchnier who agreed with outpatient follow-up today in the office. Patient was pain-free prior to discharge.  ____________________________________________   FINAL CLINICAL IMPRESSION(S) / ED DIAGNOSES  Final diagnoses:  Celiac artery aneurysm      Darci Currentandolph N Arbadella Kimbler, MD 08/01/14 [phone]

## 2014-08-01 NOTE — ED Notes (Signed)
Dr. Brown at bedside

## 2015-02-14 IMAGING — CT CT ANGIOGRAPHY ABDOMEN
3 of 10 series · 9 of 46 positions shown, 15 images · IV contrast (isovue)
Comparison: 01/02/2014

CLINICAL DATA: Admitted with uncontrolled hypertension and splenic
laceration with persistent moderate severe abdominal pain and
constipation. Evaluate for interval bleeding. Normal amylase and
lipase levels. Subsequent encounter.

EXAM:
CT ANGIOGRAPHY ABDOMEN
TECHNIQUE: Multidetector CT imaging of the abdomen was performed using the
standard protocol during bolus administration of intravenous
contrast. Multiplanar reconstructed images including MIPs were
obtained and reviewed to evaluate the vascular anatomy.
CONTRAST:  125 cc Isovue 370

[Series 4: arterial · axial · arterial · 0.75mm/px · z∈[-714,-670]mm · 2 of 179 slices shown]
[im 12/179  soft-tissue]
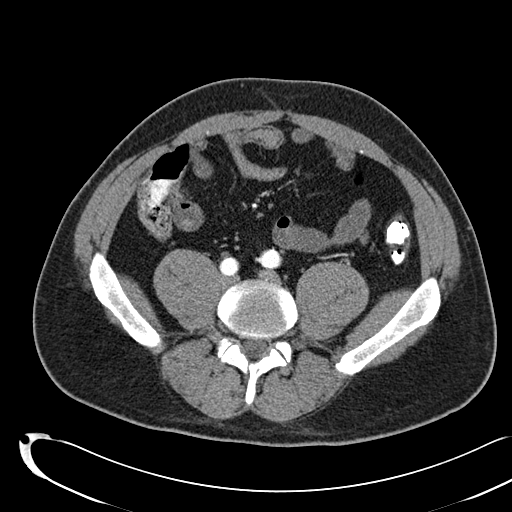
[im 34/179  soft-tissue]
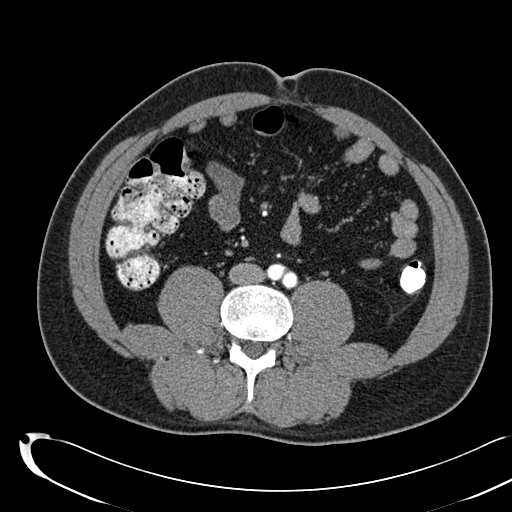

[Series 6: venous · axial · portal-venous · 0.75mm/px · z∈[-680,-440]mm · 5 of 72 slices shown, 10 images]
[im 12/72  soft-tissue]
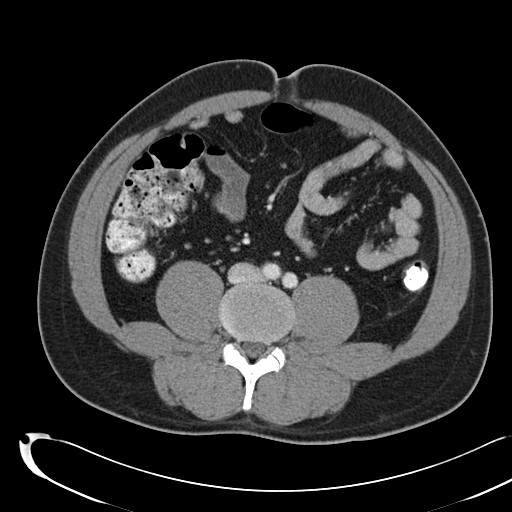
[im 12/72  bone]
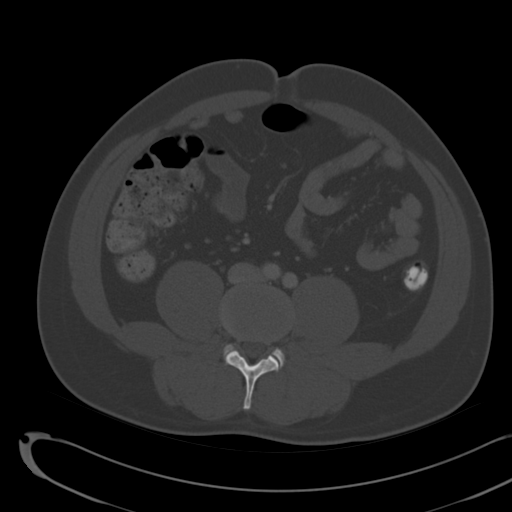
[im 24/72  soft-tissue]
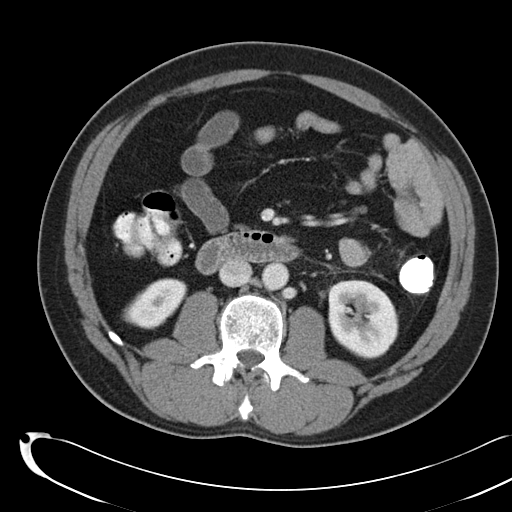
[im 24/72  lung]
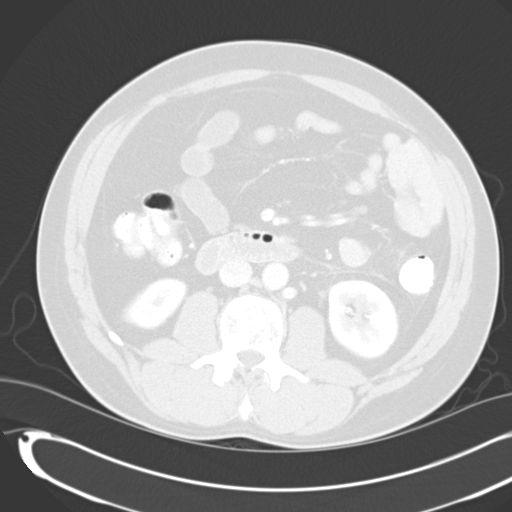
[im 36/72  soft-tissue]
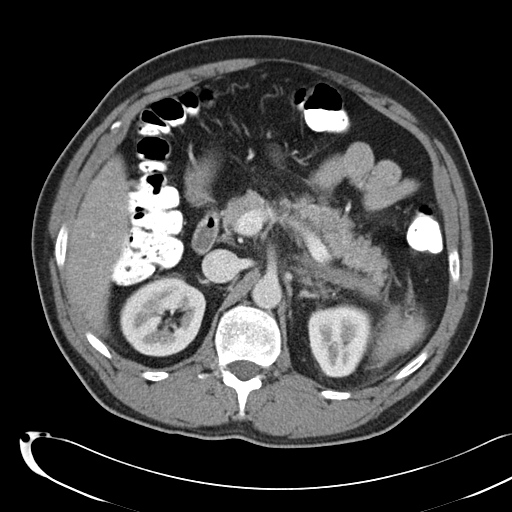
[im 36/72  lung]
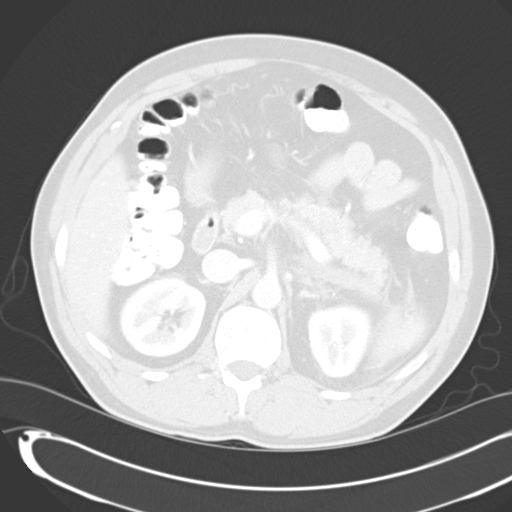
[im 48/72  soft-tissue]
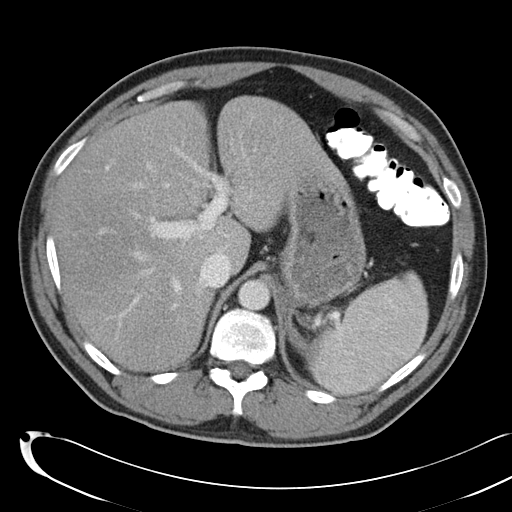
[im 48/72  lung]
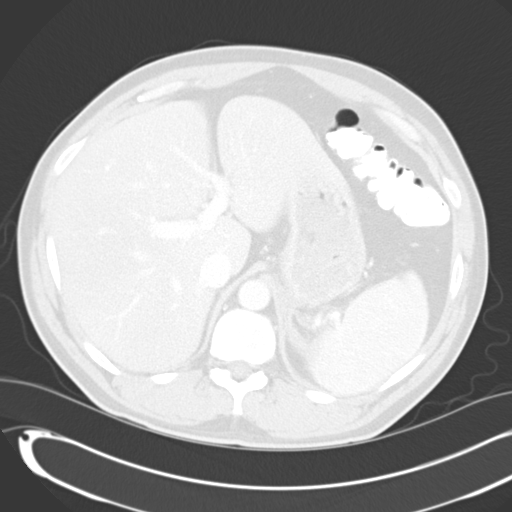
[im 60/72  soft-tissue]
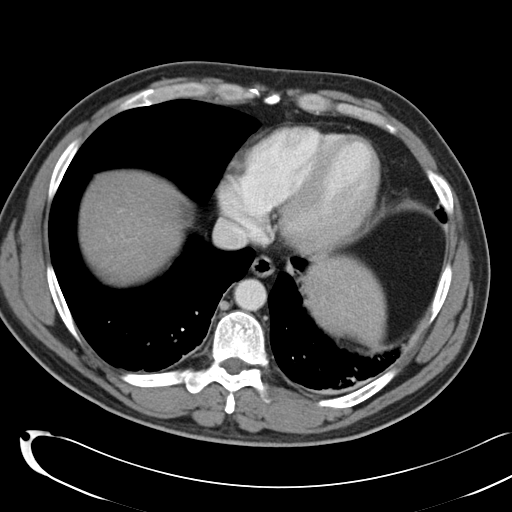
[im 60/72  lung]
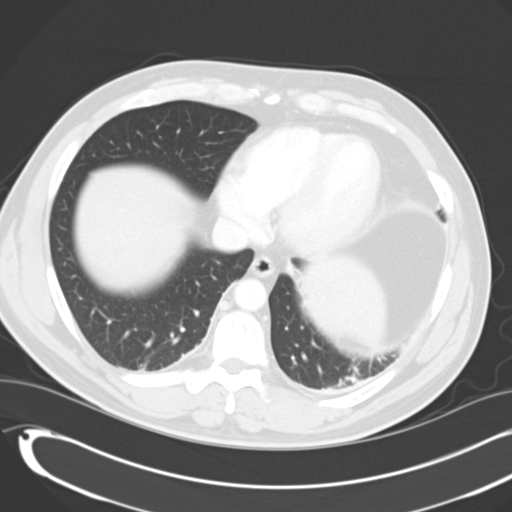

[Series 10: cor arterial mpr · coronal · arterial · 0.71mm/px · 2 of 152 slices shown, 3 images]
[im 51/152  soft-tissue]
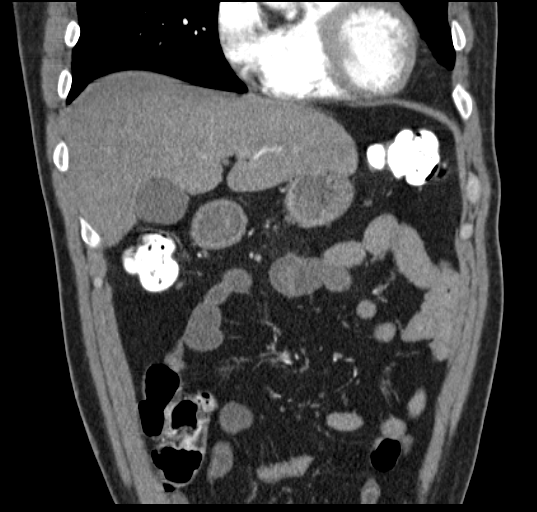
[im 51/152  bone]
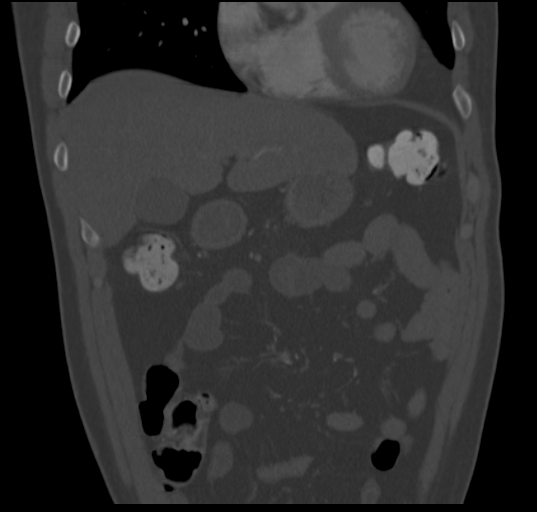
[im 101/152  soft-tissue]
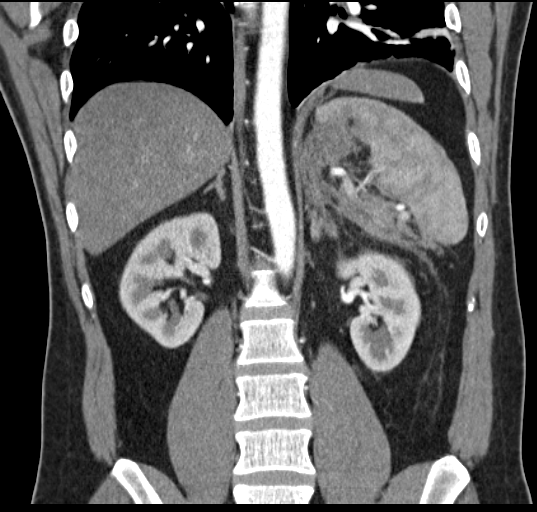

[9 of 46 positions shown; findings below may reference images not displayed]

FINDINGS: Vascular Findings:

Abdominal aorta: Normal caliber the abdominal aorta. No abdominal
aortic dissection or periaortic stranding.

Celiac artery: There is marked vessel irregularity involving the
origin proximal aspect of the celiac artery with near
circumferential vessel wall thickening and a contained
pseudoaneurysm arising from the inferior aspect of the proximal main
trunk of the celiac artery which measures approximately 0.7 cm in
diameter (sagittal image 89, series 11; coronal image 83, series
10). The wall thickening involving the main trunk of the celiac
artery results in approximately 70% luminal narrowing (sagittal
image 90, series 11).

Wall thickening extends to involve the origin and proximal aspects
of the splenic artery (representative axial images 82, series 4 and
coronal images 73 and 76, series 10), though not definitely
resulting in hemodynamically significant stenosis at these
locations. The remainder of the splenic artery appears widely patent
without evidence of dissection are vessel irregularity. The splenic
vein appears patent.

The distal branch vessels of the celiac trunk appear patent. There
are no definitive areas of contrast extravasation. The amount of
stranding and blood within the left upper abdominal quadrant and
about the posterior aspect of the pancreatic bed is grossly
unchanged and without peripheral wall enhancement to suggest a
definable/drainable fluid collection.

SMA: Widely patent without hemodynamically significant narrowing. No
perivascular stranding or vessel wall irregularity.

Right Renal artery: Solitary; widely patent without hemodynamically
significant narrowing.

Left Renal artery: Solitary ; widely patent without hemodynamically
significant narrowing. Incidental note is made of a circumaortic
left renal vein.

IMA: Widely patent throughout its imaged course.

Pelvic vasculature: There is mild ectasia of the left common iliac
artery measuring approximately 1.6 cm in diameter (image 169, series
4). The right common iliac artery appears of normal caliber
throughout its imaged course.

Review of the MIP images confirms the above findings.

--------------------------------------------------------------------------------

Nonvascular Findings:

Normal hepatic contour. There is diffuse decreased attenuation of
the hepatic parenchyma on this postcontrast examination suggestive
of hepatic steatosis. No discrete hepatic lesions. Normal appearance
of the gallbladder. No radiopaque gallstones. No intra or
extrahepatic biliary duct dilatation. No ascites.

There is symmetric enhancement and excretion of the bilateral
kidneys. No definite renal stones on this postcontrast examination.
No discrete renal lesions. No urinary obstruction. Normal appearance
of the bilateral adrenal glands.

Vascular findings associated with the pancreas and spleen as above.
There is homogeneous enhancement of the pancreatic parenchyma. No
definite pancreatic ductal dilatation. Normal appearance of the
spleen. No evidence of splenic laceration or infarction.

Ingestion contrast extends to the level of the imaged descending
colon. No evidence of enteric obstruction. No pneumoperitoneum,
pneumatosis or portal venous gas. No retroperitoneal or mesenteric
lymphadenopathy.

Limited visualization of lower thorax demonstrates grossly unchanged
appearance of the ill-defined approximately 1 cm nodule within the
right upper lobe, incompletely imaged (image 1, series 14).
Worsening subsegmental atelectasis within the left costophrenic
angle an the dependent portions of the bilateral lower lobes. No
discrete focal airspace opacities. No pleural effusion.

Borderline cardiomegaly.  No pericardial effusion.

No acute or aggressive osseous abnormalities with special attention
paid to the left-sided ribs. Regional soft tissues appear normal.
Note is made of a small (approximately 1.3 x 1.7 cm mesenteric fat
containing periumbilical hernia.

Review of the MIP images confirms the above findings.
IMPRESSION: 1. Vessel wall irregularity involving the origin and proximal
aspects of the celiac artery with associated wall thickening and a
contained pseudoaneurysm arising from the inferior aspect of the
proximal main trunk of the celiac artery which measures
approximately 0.7 cm in diameter. The near circumferential wall
thickening results in approximately 70% luminal narrowing of the
main trunk of the celiac artery. The etiology of these findings are
indeterminate though in the absence of known trauma and/or
pancreatitis, these findings could be potentially be the sequela of
a spontaneous dissection given history of patient's chronic
patient's uncontrolled hypertension.
2. The circumferential wall thickening extends to involve the origin
and proximal aspects of splenic artery without resulting in a
hemodynamically significant stenosis. No definite splenic artery
dissection or aneurysm.
3. The amount of stranding and blood within the left upper abdominal
quadrant and posterior to the pancreatic bed is unchanged and
without peripheral wall enhancement to suggest a definable/drainable
fluid collection.
4. Mild ectasia of the left common iliac artery measuring 1.6 cm in
diameter.
5. Hepatic steatosis.
6. Grossly unchanged approximately 0.8 cm nodule within the right
upper lobe, incompletely imaged. If the patient is at high risk for
bronchogenic carcinoma, follow-up chest CT at 3-6 months is
recommended. If the patient is at low risk for bronchogenic
carcinoma, follow-up chest CT at 6-12 months is recommended. This
recommendation follows the consensus statement: Guidelines for
Management of Small Pulmonary Nodules Detected on CT Scans: A
Statement from the [HOSPITAL] as published in Radiology
3446; [DATE].
Critical Value/emergent results were called by telephone at the time
of interpretation on 01/04/2014 at [DATE] to Dr. FLOTZINGER MERTEN and
Dr. Jamell, who both verbally acknowledged these results.

## 2015-10-27 ENCOUNTER — Encounter
Admission: RE | Admit: 2015-10-27 | Discharge: 2015-10-27 | Disposition: A | Discharge status: Home / Self Care | Payer: No Typology Code available for payment source | Source: Ambulatory Visit | Attending: Surgery | Admitting: Surgery

## 2015-10-27 HISTORY — DX: Gastro-esophageal reflux disease without esophagitis: K21.9

## 2015-10-27 NOTE — Patient Instructions (Signed)
  Your procedure is scheduled on: 11-06-15 [facility](THURSDAY) Report to Same Day Surgery 2nd floor medical mall To find out your arrival time please call [phone] between 1PM - 3PM on 11-05-15 [facility](WEDNESDAY)  Remember: Instructions that are not followed completely may result in serious medical risk, up to and including death, or upon the discretion of your surgeon and anesthesiologist your surgery may need to be rescheduled.    _x___ 1. Do not eat food or drink liquids after midnight. No gum chewing or hard candies.     __x__ 2. No Alcohol for 24 hours before or after surgery.   __x__3. No Smoking for 24 prior to surgery.   ____  4. Bring all medications with you on the day of surgery if instructed.    __x__ 5. Notify your doctor if there is any change in your medical condition     (cold, fever, infections).     Do not wear jewelry, make-up, hairpins, clips or nail polish.  Do not wear lotions, powders, or perfumes. You may wear deodorant.  Do not shave 48 hours prior to surgery. Men may shave face and neck.  Do not bring valuables to the hospital.    [facility] is not responsible for any belongings or valuables.               Contacts, dentures or bridgework may not be worn into surgery.  Leave your suitcase in the car. After surgery it may be brought to your room.  For patients admitted to the hospital, discharge time is determined by your treatment team.   Patients discharged the day of surgery will not be allowed to drive home.    Please read over the following fact sheets that you were given:   [facility] Preparing [facility] and or MRSA Information   _x___ Take these medicines the morning of surgery with A SIP OF WATER:    1. AMLODIPINE (NORVASC)  2. METOPROLOL  3. ZOLOFT (SERTRALINE)  4.  5.  6.  ____ Fleet Enema (as directed)   _x___ Use CHG Soap or sage wipes as directed on instruction sheet   ____ Use inhalers on the day of surgery and bring to hospital day of  surgery  ____ Stop metformin 2 days prior to surgery    ____ Take 1/2 of usual insulin dose the night before surgery and none on the morning of surgery.   ____ Stop aspirin or coumadin, or plavix  _x__ Stop Anti-inflammatories such as Advil, Aleve, Ibuprofen, Motrin, Naproxen,          Naprosyn, Goodies powders or aspirin products NOW-Ok to take Tylenol.   ____ Stop supplements until after surgery.    ____ Bring C-Pap to the hospital.

## 2015-10-29 ENCOUNTER — Ambulatory Visit
Admission: RE | Admit: 2015-10-29 | Discharge: 2015-10-29 | Disposition: A | Discharge status: Home / Self Care | Payer: PRIVATE HEALTH INSURANCE | Source: Ambulatory Visit | Attending: Surgery | Admitting: Surgery

## 2015-10-29 DIAGNOSIS — Z01810 Encounter for preprocedural cardiovascular examination: Secondary | ICD-10-CM | POA: Diagnosis present

## 2015-10-29 DIAGNOSIS — Z01812 Encounter for preprocedural laboratory examination: Secondary | ICD-10-CM | POA: Diagnosis present

## 2015-10-29 LAB — POTASSIUM: POTASSIUM: 3.4 mmol/L — AB (ref 3.5–5.1)

## 2015-11-06 ENCOUNTER — Encounter: Payer: Self-pay | Admitting: *Deleted

## 2015-11-06 ENCOUNTER — Ambulatory Visit
Admission: RE | Admit: 2015-11-06 | Discharge: 2015-11-06 | Disposition: A | Discharge status: Home / Self Care | Payer: PRIVATE HEALTH INSURANCE | Source: Ambulatory Visit | Attending: Surgery | Admitting: Surgery

## 2015-11-06 ENCOUNTER — Ambulatory Visit: Payer: PRIVATE HEALTH INSURANCE | Admitting: *Deleted

## 2015-11-06 ENCOUNTER — Encounter
Admission: RE | Disposition: A | Discharge status: Home / Self Care | Payer: Self-pay | Source: Ambulatory Visit | Attending: Surgery

## 2015-11-06 ENCOUNTER — Telehealth: Payer: Self-pay | Admitting: General Surgery

## 2015-11-06 DIAGNOSIS — I728 Aneurysm of other specified arteries: Secondary | ICD-10-CM | POA: Diagnosis not present

## 2015-11-06 DIAGNOSIS — I739 Peripheral vascular disease, unspecified: Secondary | ICD-10-CM | POA: Diagnosis not present

## 2015-11-06 DIAGNOSIS — K429 Umbilical hernia without obstruction or gangrene: Secondary | ICD-10-CM | POA: Diagnosis not present

## 2015-11-06 DIAGNOSIS — E039 Hypothyroidism, unspecified: Secondary | ICD-10-CM | POA: Insufficient documentation

## 2015-11-06 DIAGNOSIS — I10 Essential (primary) hypertension: Secondary | ICD-10-CM | POA: Insufficient documentation

## 2015-11-06 DIAGNOSIS — Z79899 Other long term (current) drug therapy: Secondary | ICD-10-CM | POA: Diagnosis not present

## 2015-11-06 DIAGNOSIS — F17290 Nicotine dependence, other tobacco product, uncomplicated: Secondary | ICD-10-CM | POA: Insufficient documentation

## 2015-11-06 HISTORY — PX: UMBILICAL HERNIA REPAIR: SHX196

## 2015-11-06 SURGERY — REPAIR, HERNIA, UMBILICAL, ADULT
Anesthesia: General | Wound class: Clean

## 2015-11-06 MED ORDER — BUPIVACAINE-EPINEPHRINE (PF) 0.5% -1:200000 IJ SOLN
INTRAMUSCULAR | Status: DC | PRN
  Administered 2015-11-06: 7 mL

## 2015-11-06 MED ORDER — FENTANYL CITRATE (PF) 100 MCG/2ML IJ SOLN: 25.0000 ug | INTRAMUSCULAR | Status: DC | PRN

## 2015-11-06 MED ORDER — HYDROCODONE-ACETAMINOPHEN 5-325 MG PO TABS: 1.0000 | ORAL_TABLET | ORAL | 0 refills | Status: AC | PRN

## 2015-11-06 MED ORDER — ROCURONIUM BROMIDE 100 MG/10ML IV SOLN
INTRAVENOUS | Status: DC | PRN
  Administered 2015-11-06: 20 mg via INTRAVENOUS

## 2015-11-06 MED ORDER — LACTATED RINGERS IV SOLN
INTRAVENOUS | Status: DC
  Administered 2015-11-06: 07:00:00 via INTRAVENOUS

## 2015-11-06 MED ORDER — SUCCINYLCHOLINE CHLORIDE 20 MG/ML IJ SOLN
INTRAMUSCULAR | Status: DC | PRN
  Administered 2015-11-06: 140 mg via INTRAVENOUS

## 2015-11-06 MED ORDER — PROPOFOL 10 MG/ML IV BOLUS
INTRAVENOUS | Status: DC | PRN
  Administered 2015-11-06: 200 mg via INTRAVENOUS

## 2015-11-06 MED ORDER — MIDAZOLAM HCL 2 MG/2ML IJ SOLN
INTRAMUSCULAR | Status: DC | PRN
  Administered 2015-11-06: 2 mg via INTRAVENOUS

## 2015-11-06 MED ORDER — ONDANSETRON HCL 4 MG/2ML IJ SOLN: 4.0000 mg | Freq: Once | INTRAMUSCULAR | Status: DC | PRN

## 2015-11-06 MED ORDER — CEFAZOLIN SODIUM-DEXTROSE 2-4 GM/100ML-% IV SOLN
2.0000 g | Freq: Once | INTRAVENOUS | Status: AC
  Administered 2015-11-06: 2 g via INTRAVENOUS

## 2015-11-06 MED ORDER — BUPIVACAINE-EPINEPHRINE (PF) 0.5% -1:200000 IJ SOLN
INTRAMUSCULAR | Status: AC
  Filled 2015-11-06: qty 30

## 2015-11-06 MED ORDER — CEFAZOLIN SODIUM-DEXTROSE 2-4 GM/100ML-% IV SOLN
INTRAVENOUS | Status: AC
  Administered 2015-11-06: 2 g via INTRAVENOUS
  Filled 2015-11-06: qty 100

## 2015-11-06 MED ORDER — ONDANSETRON HCL 4 MG/2ML IJ SOLN
INTRAMUSCULAR | Status: DC | PRN
  Administered 2015-11-06: 4 mg via INTRAVENOUS

## 2015-11-06 MED ORDER — FAMOTIDINE 20 MG PO TABS
20.0000 mg | ORAL_TABLET | Freq: Once | ORAL | Status: AC
  Administered 2015-11-06: 20 mg via ORAL

## 2015-11-06 MED ORDER — FENTANYL CITRATE (PF) 100 MCG/2ML IJ SOLN
INTRAMUSCULAR | Status: DC | PRN
  Administered 2015-11-06: 100 ug via INTRAVENOUS

## 2015-11-06 MED ORDER — HYDROCODONE-ACETAMINOPHEN 5-325 MG PO TABS: 1.0000 | ORAL_TABLET | ORAL | Status: DC | PRN

## 2015-11-06 MED ORDER — FAMOTIDINE 20 MG PO TABS
ORAL_TABLET | ORAL | Status: AC
  Administered 2015-11-06: 20 mg via ORAL
  Filled 2015-11-06: qty 1

## 2015-11-06 MED ORDER — SUGAMMADEX SODIUM 200 MG/2ML IV SOLN
INTRAVENOUS | Status: DC | PRN
  Administered 2015-11-06: 208.6 mg via INTRAVENOUS

## 2015-11-06 SURGICAL SUPPLY — 25 items
BLADE CLIPPER SURG (BLADE) ×3 IMPLANT
BLADE SURG 15 STRL LF DISP TIS (BLADE) ×1 IMPLANT
BLADE SURG 15 STRL SS (BLADE) ×2
CANISTER SUCT 1200ML W/VALVE (MISCELLANEOUS) ×3 IMPLANT
CHLORAPREP W/TINT 26ML (MISCELLANEOUS) ×3 IMPLANT
DRAPE LAPAROTOMY 77X122 PED (DRAPES) ×3 IMPLANT
ELECT REM PT RETURN 9FT ADLT (ELECTROSURGICAL) ×3
ELECTRODE REM PT RTRN 9FT ADLT (ELECTROSURGICAL) ×1 IMPLANT
GLOVE BIO SURGEON STRL SZ7.5 (GLOVE) ×12 IMPLANT
GOWN STRL REUS W/ TWL LRG LVL3 (GOWN DISPOSABLE) ×2 IMPLANT
GOWN STRL REUS W/TWL LRG LVL3 (GOWN DISPOSABLE) ×4
KIT RM TURNOVER STRD PROC AR (KITS) ×3 IMPLANT
LABEL OR SOLS (LABEL) ×3 IMPLANT
LIQUID BAND (GAUZE/BANDAGES/DRESSINGS) ×3 IMPLANT
MESH SYNTHETIC 4X6 SOFT BARD (Mesh General) ×1 IMPLANT
MESH SYNTHETIC SOFT BARD 4X6 (Mesh General) ×2 IMPLANT
NEEDLE HYPO 25X1 1.5 SAFETY (NEEDLE) ×3 IMPLANT
NS IRRIG 500ML POUR BTL (IV SOLUTION) ×3 IMPLANT
PACK BASIN MINOR ARMC (MISCELLANEOUS) ×3 IMPLANT
SUT CHROMIC 3 0 SH 27 (SUTURE) ×3 IMPLANT
SUT CHROMIC 4 0 RB 1X27 (SUTURE) ×3 IMPLANT
SUT MNCRL+ 5-0 UNDYED PC-3 (SUTURE) ×1 IMPLANT
SUT MONOCRYL 5-0 (SUTURE) ×2
SUT SURGILON 0 30 BLK (SUTURE) ×3 IMPLANT
SYRINGE 10CC LL (SYRINGE) ×3 IMPLANT

## 2015-11-06 NOTE — Telephone Encounter (Signed)
Patient had umbilical hernia repair w/ ET anesthesia today.  Wife reported noting some "bloodshot" in eyes while at hospital. Worse since home.  No pain.  Vision at worst a little fuzzy. Patient remembers coughing on awakening. Spoke w/ Dr. Brooke DareKing from opthalmology. Likely subconjunctival hemorrhage that should be self limiting. Artifical tears for comfort.   I recommended: Cool compress recommended.  Avoid ASA, NSAIA today.  Report to ED iv visual loss.

## 2015-11-06 NOTE — OR Nursing (Signed)
Dr. Smith into see pt and family.  OK to dc home. 

## 2015-11-06 NOTE — Anesthesia Postprocedure Evaluation (Signed)
Anesthesia Post Note  Patient: Mitchell Butler  Procedure(s) Performed: Procedure(s) (LRB): HERNIA REPAIR UMBILICAL ADULT (N/A)  Patient location during evaluation: PACU Anesthesia Type: General Level of consciousness: awake and alert and oriented Pain management: pain level controlled Vital Signs Assessment: post-procedure vital signs reviewed and stable Respiratory status: spontaneous breathing Cardiovascular status: blood pressure returned to baseline Anesthetic complications: no    Last Vitals:  Vitals:   11/06/15 0921 11/06/15 0952  BP: 122/86 124/88  Pulse: 61 65  Resp: 16 16  Temp: (!) 35.6 C     Last Pain:  Vitals:   11/06/15 0921  TempSrc: Tympanic                 Adom Schoeneck

## 2015-11-06 NOTE — Anesthesia Preprocedure Evaluation (Addendum)
Anesthesia Evaluation  Patient identified by MRN, date of birth, ID band Patient awake    Reviewed: Allergy & Precautions, NPO status , Patient's Chart, lab work & pertinent test results, reviewed documented beta blocker date and time   Airway Mallampati: II  TM Distance: >3 FB     Dental no notable dental hx. (+) Chipped   Pulmonary Current Smoker,    Pulmonary exam normal        Cardiovascular hypertension, Pt. on medications and Pt. on home beta blockers + Peripheral Vascular Disease  Normal cardiovascular exam     Neuro/Psych negative neurological ROS  negative psych ROS   GI/Hepatic Neg liver ROS, GERD  Medicated and Controlled,  Endo/Other  negative endocrine ROS  Renal/GU negative Renal ROS  negative genitourinary   Musculoskeletal negative musculoskeletal ROS (+)   Abdominal Normal abdominal exam  (+)   Peds negative pediatric ROS (+)  Hematology negative hematology ROS (+)   Anesthesia Other Findings Celiac artery aneurism  Reproductive/Obstetrics                            Anesthesia Physical Anesthesia Plan  ASA: III  Anesthesia Plan: General   Post-op Pain Management:    Induction: Intravenous  Airway Management Planned: Oral ETT  Additional Equipment:   Intra-op Plan:   Post-operative Plan: Extubation in OR  Informed Consent: I have reviewed the patients History and Physical, chart, labs and discussed the procedure including the risks, benefits and alternatives for the proposed anesthesia with the patient or authorized representative who has indicated his/her understanding and acceptance.   Dental advisory given  Plan Discussed with: CRNA and Surgeon  Anesthesia Plan Comments:         Anesthesia Quick Evaluation

## 2015-11-06 NOTE — Anesthesia Procedure Notes (Signed)
Procedure Name: Intubation Date/Time: 11/06/2015 7:41 AM Performed by: Edyth GunnelsGILBERT, Betania Dizon Pre-anesthesia Checklist: Patient identified, Emergency Drugs available, Patient being monitored, Timeout performed and Suction available Patient Re-evaluated:Patient Re-evaluated prior to inductionOxygen Delivery Method: Circle system utilized Preoxygenation: Pre-oxygenation with 100% oxygen Intubation Type: IV induction Ventilation: Mask ventilation without difficulty Laryngoscope Size: Mac and 4 Grade View: Grade III Tube type: Oral Number of attempts: 1 Airway Equipment and Method: Stylet Secured at: 24 cm Tube secured with: Tape Dental Injury: Teeth and Oropharynx as per pre-operative assessment  Comments: Very poor dentition. Multiple missing teeth, also cracked and chipped.

## 2015-11-06 NOTE — Op Note (Signed)
OPERATIVE REPORT  PREOPERATIVE  DIAGNOSIS: . Umbilical hernia   POSTOPERATIVE DIAGNOSIS: . Umbilical hernia  PROCEDURE: . Umbilical hernia repair  ANESTHESIA:  General  SURGEON: Mitchell RollsWilton Smith  MD   INDICATIONS: . He reports a history of pain at the umbilicus. He had physical findings of a small umbilical hernia. Repair is recommended to resolve the pain.  With the patient on the operating table in the supine position he was placed under general anesthesia. The abdomen was prepared with ChloraPrep and draped in a sterile manner.  A transversely oriented supraumbilical curvilinear incision was made and carried down through subcutaneous tissues. An umbilical hernia sac was dissected free from surrounding structures and dissected free from the fascial ring defect and reduced into the abdominal cavity. Properitoneal fat was dissected away from the fascial ring defect. Bard soft mesh was cut to create an oval shape of 1.4 x 2 cm in dimension. This was placed into the properitoneal plane and sutured to the overlying fascia with through and through 0 Surgilon sutures. The repair was carried out with a transversely oriented suture line of interrupted 0 Surgilon figure-of-eight sutures incorporating each suture into the mesh. The subcutaneous tissues were infiltrated with half percent Sensorcaine with epinephrine. Hemostasis was intact. The skin of the umbilicus was sutured to the deep fascia with 5-0 Monocryl. The skin was closed with running 5-0 Monocryl subcuticular suture and LiquiBand. The patient tolerated surgery satisfactorily and was then prepared for transfer to the recovery room  [facility] M.D.

## 2015-11-06 NOTE — H&P (Signed)
  He reports no change in condition since office exam.  Labs noted.  Discussed plan for umbilical hernia repair and care.

## 2015-11-06 NOTE — Transfer of Care (Signed)
Immediate Anesthesia Transfer of Care Note  Patient: Steele SizerDarrell W Flater  Procedure(s) Performed: Procedure(s): HERNIA REPAIR UMBILICAL ADULT (N/A)  Patient Location: PACU  Anesthesia Type:General  Level of Consciousness: awake, alert  and oriented  Airway & Oxygen Therapy: Patient Spontanous Breathing and Patient connected to face mask  Post-op Assessment: Report given to RN and Post -op Vital signs reviewed and stable  Post vital signs: Reviewed and stable  Last Vitals:  Vitals:   11/06/15 0624 11/06/15 0832  BP: (!) 130/99 (!) (P) 124/93  Pulse: 71   Resp: 16 (!) (P) 24  Temp: 36.3 C (P) 36.1 C    Last Pain:  Vitals:   11/06/15 0624  TempSrc: Oral         Complications: No apparent anesthesia complications

## 2015-11-06 NOTE — Discharge Instructions (Signed)
AMBULATORY SURGERY  °DISCHARGE INSTRUCTIONS ° ° °1) The drugs that you were given will stay in your system until tomorrow so for the next 24 hours you should not: ° °A) Drive an automobile °B) Make any legal decisions °C) Drink any alcoholic beverage ° ° °2) You may resume regular meals tomorrow.  Today it is better to start with liquids and gradually work up to solid foods. ° °You may eat anything you prefer, but it is better to start with liquids, then soup and crackers, and gradually work up to solid foods. ° ° °3) Please notify your doctor immediately if you have any unusual bleeding, trouble breathing, redness and pain at the surgery site, drainage, fever, or pain not relieved by medication. ° ° ° °4) Additional Instructions: ° ° ° ° ° ° ° °Please contact your physician with any problems or Same Day Surgery at [phone], Monday through Friday 6 am to 4 pm, or [place] [facility] number at [phone].Take Tylenol or Norco if needed for pain. ° °Should not drive or do anything dangerous when taking Norco. ° °May shower. ° °Avoid straining and heavy lifting. °

## 2015-11-07 ENCOUNTER — Encounter: Payer: Self-pay | Admitting: Surgery

## 2021-11-02 ENCOUNTER — Other Ambulatory Visit: Payer: Self-pay

## 2021-11-02 ENCOUNTER — Ambulatory Visit
Admission: RE | Admit: 2021-11-02 | Discharge: 2021-11-02 | Disposition: A | Discharge status: Home / Self Care | Payer: Self-pay | Source: Ambulatory Visit | Attending: Neurosurgery | Admitting: Neurosurgery

## 2021-11-02 DIAGNOSIS — Z049 Encounter for examination and observation for unspecified reason: Secondary | ICD-10-CM

## 2021-11-10 ENCOUNTER — Ambulatory Visit: Payer: BC Managed Care – PPO | Admitting: Neurosurgery

## 2021-11-26 ENCOUNTER — Ambulatory Visit: Payer: BC Managed Care – PPO | Admitting: Neurosurgery

## 2021-11-26 ENCOUNTER — Encounter: Payer: Self-pay | Admitting: Neurosurgery

## 2021-11-26 VITALS — BP 128/84 | Ht 74.0 in | Wt 252.0 lb

## 2021-11-26 DIAGNOSIS — M5442 Lumbago with sciatica, left side: Secondary | ICD-10-CM

## 2021-11-26 DIAGNOSIS — G8929 Other chronic pain: Secondary | ICD-10-CM

## 2021-11-26 DIAGNOSIS — M5441 Lumbago with sciatica, right side: Secondary | ICD-10-CM | POA: Diagnosis not present

## 2021-11-26 MED ORDER — GABAPENTIN 300 MG PO CAPS: 300.0000 mg | ORAL_CAPSULE | Freq: Every day | ORAL | 1 refills | Status: AC

## 2021-11-26 NOTE — Progress Notes (Signed)
     HISTORY OF PRESENT ILLNESS: 11/26/2021 Mr. Mitchell Butler is doing well.  He has some difficult days, but he is doing well without significant leg or back pain currently.Marland Kitchen   PHYSICAL EXAMINATION:   Vitals:   11/26/21 1604  BP: 128/84   General: Patient is well developed, well nourished, calm, collected, and in no apparent distress.  NEUROLOGICAL:  General: In no acute distress.  Awake, alert, oriented to person, place, and time. Pupils equal round and reactive to light.   Strength:  Moves all extremities well 5 out of 5 grossly  ROS (Neurologic): Negative except as noted above  IMAGING: No interval imaging to review   ASSESSMENT/PLAN:  Mitchell Butler is doing well.  He will continue to use NSAIDs as needed.  He will continue gabapentin.  If his pain returns, he will let me know we will reevaluate.  See him back on an as-needed basis.  I spent a total of 10 minutes in face-to-face and non-face-to-face activities related to this patient's care today.   Venetia Night MD, [facility] Department of Neurosurgery

## 2022-08-05 ENCOUNTER — Ambulatory Visit: Payer: BC Managed Care – PPO

## 2022-08-05 DIAGNOSIS — K5730 Diverticulosis of large intestine without perforation or abscess without bleeding: Secondary | ICD-10-CM

## 2022-08-05 DIAGNOSIS — K640 First degree hemorrhoids: Secondary | ICD-10-CM

## 2022-08-05 DIAGNOSIS — Z1211 Encounter for screening for malignant neoplasm of colon: Secondary | ICD-10-CM

## 2022-11-09 ENCOUNTER — Other Ambulatory Visit
Admission: RE | Admit: 2022-11-09 | Discharge: 2022-11-09 | Disposition: A | Discharge status: Home / Self Care | Payer: BC Managed Care – PPO | Source: Ambulatory Visit | Attending: Internal Medicine | Admitting: Internal Medicine

## 2022-11-09 DIAGNOSIS — U099 Post covid-19 condition, unspecified: Secondary | ICD-10-CM | POA: Diagnosis present

## 2022-11-09 DIAGNOSIS — R0609 Other forms of dyspnea: Secondary | ICD-10-CM | POA: Diagnosis present

## 2022-11-09 LAB — D-DIMER, QUANTITATIVE: D-Dimer, Quant: 1.1 ug{FEU}/mL — ABNORMAL HIGH (ref 0.00–0.50)

## 2022-11-11 ENCOUNTER — Other Ambulatory Visit: Payer: Self-pay | Admitting: Internal Medicine

## 2022-11-11 DIAGNOSIS — R0609 Other forms of dyspnea: Secondary | ICD-10-CM

## 2022-11-12 ENCOUNTER — Ambulatory Visit
Admission: RE | Admit: 2022-11-12 | Discharge: 2022-11-12 | Disposition: A | Discharge status: Home / Self Care | Payer: BC Managed Care – PPO | Source: Ambulatory Visit | Attending: Internal Medicine | Admitting: Internal Medicine

## 2022-11-12 DIAGNOSIS — R0609 Other forms of dyspnea: Secondary | ICD-10-CM

## 2022-11-12 MED ORDER — IOPAMIDOL (ISOVUE-370) INJECTION 76%
100.0000 mL | Freq: Once | INTRAVENOUS | Status: AC | PRN
  Administered 2022-11-12: 100 mL via INTRAVENOUS

## 2024-03-28 NOTE — Progress Notes (Signed)
 ENCOUNTER: Patient Class :No patient class for patient encounter Department: [facility] [address] [place] [address]  PATIENT: Patient Demographics      Name Patient ID SSN Gender Identity Birth Date   Mitchell Butler, Mitchell Butler [id number] Male 02/05/2071 (53 yrs)          Address Phone Email       [address] [place] [address] [phone] ([email or web address]            [place] Race         [place] Caucasian/White             Reg Status PCP Date Last Verified Next Review Date     Verified [name] Novel [id number] 03/28/24 04/27/24           Marital Status Religion Language       Married Non-Denominational English              EMERGENCY CONTACT: Name Relationship Lgl Grd Work Marine Scientist Phone  1. Schroeter,LISA Spouse   [phone]     GUARANTOR: There is no guarantor information entered for this encounter.  COVERAGE: Primary Visit Coverage      Payer Plan Group Number Group Name Payer Phone Plan Phone   No coverage found                Secondary Visit Coverage      Payer Plan Group Number Group Name Payer Phone Plan Phone   No coverage found                Primary Coverage      Payer Plan Group Number Group Name Payer Phone Plan Phone   BLUE [employer] [place] [employer] [place] [employer] [place] OPTIONS [place] [id number] [facility] [employer] [facility]  [id number]           Primary Subscriber      Subscriber ID Subscriber Name Subscriber [facility] Subscriber Address   [id number] [facility] [id number] [address]      [place], [place] [address]           Secondary Coverage      Payer Plan Group Number Group Name Payer Phone Plan Phone   No coverage found

## 2024-03-28 NOTE — Progress Notes (Signed)
 " ANNUAL EXAM  Mitchell Butler is a 54 y.o. male  CHIEF COMPLAINT: Chief Complaint  Patient presents with   Annual Exam    SUBJECTIVE: Patient really struggling with his back.  Known L5-S1 disease having urinary leakage, urinary incontinence, cannot feel his bladder.  Cannot sit down for more than a few minutes, has trouble driving.  Only relief is walking around.  Trouble sleeping.  Very concerning and progressive.  Physical therapy did not help.  Blood pressure well-controlled ______________________________________________________________________ A comprehensive ROS was negative in all 10 systems reviewed.  ALLERGIES: Flomax [tamsulosin]  Past Medical History:  Diagnosis Date   Hypertension    Hypothyroidism     Past Surgical History:  Procedure Laterality Date   METAL RIGHT ARM FRACTURE  12/13/2000   ARTHROSCOPY RIGHT KNEE  11/04/2011   with excision plica band, removal of loose body, partial lateral meniscectomy   COLONOSCOPY  08/05/2022   Colonoscopy unremarkable. Can repeat in 10 years/CTL   TONSILLECTOMY      Current Outpatient Medications  Medication Sig Dispense Refill   amLODIPine (NORVASC) 10 MG tablet Take 1 tablet (10 mg total) by mouth once daily 90 tablet 3   BD INSULIN SYRINGE 1 mL 25 x 1 Syrg Inject 1 Syringe subcutaneously every 30 (thirty) days 12 each 1   colchicine (COLCRYS) 0.6 mg tablet Take 1 tablet (0.6 mg total) by mouth 2 (two) times daily as needed 60 tablet 2   cyanocobalamin (VITAMIN B12) 1,000 mcg/mL injection Inject 1 mL (1,000 mcg total) into the muscle monthly 10 mL 11   metoprolol SUCCinate (TOPROL-XL) 50 MG XL tablet Take 0.5 tablets (25 mg total) by mouth once daily 45 tablet 3   gabapentin  (NEURONTIN ) 100 MG capsule 1 or 2 at bedtime 60 capsule 11   No current facility-administered medications for this visit.    PHYSICAL EXAM: BP 120/72   Ht 185.4 cm (6' 1)   Wt (!) 118.5 kg (261 lb 3.2 oz)   SpO2 96%   BMI 34.46  kg/m   Body mass index is 34.46 kg/m.  Wt Readings from Last 2 Encounters:  03/28/24 (!) 118.5 kg (261 lb 3.2 oz)  03/24/23 (!) 115.9 kg (255 lb 9.6 oz)    BP Readings from Last 2 Encounters:  03/28/24 120/72  03/24/23 120/68    General. Alert oriented x3  Skin. No suspicious lesions or moles on trunk or abdomen Eyes. Sclera and conjunctiva clear; pupils equal round and reactive to light and accommodation; extraocular movements intact Ears. External normal; canals clear; tympanic membranes normal Nose. Mucosa healthy without drainage or ulceration Oropharynx. No suspicious lesions Neck. No swelling, masses, stiffness, pain, limited movement, carotid pulses normal bilaterally, thyroid normal size, no masses palpated.  No bruits Breasts. No masses noted Lungs. Respirations unlabored; clear to auscultation bilaterally Back. No spinal deformity Cardiovascular. Heart regular rate and rhythm without murmurs, gallops, or rubs Abdomen. Soft; non tender; non distended; normoactive bowel sounds; no masses or organomegaly Lymph Nodes. No significant cervical, supraclavicular, axillary or inguinal lymphadenopathy noted Musculoskeletal. No deformities; no active joint inflammation Extremities. Normal, no edema Pulses. Dorsalis pedis palpable and symmetric bilaterally Neurologic. Alert and oriented X3; speech intact; face symmetrical; moves all extremities well    ASSESSMENT/PLAN:  Lumbar myelopathy-very concerning of bladder symptoms are red flag symptoms, known L5-S1 disease, urgent surgical referral, LS MRI ordered, needs surgery, physical therapy not helpful. Hypertension-controlled B12 deficiency-on B12 shots  6-week reevaluation   Goals Addressed   None     .  phq2  Return in about 1 year (around 03/28/2025) for physical.               "

## 2024-03-30 ENCOUNTER — Telehealth: Payer: Self-pay | Admitting: Neurosurgery

## 2024-03-30 NOTE — Telephone Encounter (Signed)
 [facility] called to inquire about a ref. Has not been seen in the system but they will resend it over. FYI

## 2024-03-30 NOTE — Telephone Encounter (Signed)
 I left a voicemail for Dr Molli nurse requesting a call back. I do not see that the MRI has been scheduled.   I spoke with Mr Pieczynski. He states that no one has called him for an MRI appt. He reports that he cannot always feel that he has to urinate and he leaks; it depends on the position he is in. For example, if he is sitting in a chair, he cannot feel he has to pee and it just comes out. But if he is standing and walking he can feel it.    PT & dry needling helped in the past (in 2023)  He said this has been going on for a couple of years  (specifically after the lumbar epidural steroid injection in 06/2021) and has gotten worse in the last 6-8 months. He reported that Dr Cleotilde mentioned sending him to a urologist, but he never went and now Dr Cleotilde said he doesn't need a urologist; he needs to see Dr Clois again.    I discussed the above with Dr Clois. Per Dr Clois, if he can initiate urination and it is less than 48 hours old, he can wait for MRI. Per his direction, I informed Mr Garciagarcia to contact us  once his MRI is completed so we can review to determine appropriateness of scheduling with our office. Mr Wuertz was in agreement with this plan.

## 2024-03-30 NOTE — Telephone Encounter (Signed)
 Referral entered into chart, and sent back for review. Please see referral for correspondence.

## 2024-04-02 NOTE — Telephone Encounter (Signed)
 I spoke with Burnard (Dr Dianne nurse) and updated her on our plan. I also informed her that as of Friday afternoon, the patient had not been contacted for his MRI appointment and that I still do not see an order in the [facility] system. She is going to reach out the person that handles that at their office.

## 2024-04-04 ENCOUNTER — Other Ambulatory Visit: Payer: Self-pay | Admitting: Internal Medicine

## 2024-04-04 DIAGNOSIS — G959 Disease of spinal cord, unspecified: Secondary | ICD-10-CM

## 2024-04-06 NOTE — Telephone Encounter (Signed)
 MRI scheduled 04/11/24 FYI

## 2024-04-11 ENCOUNTER — Inpatient Hospital Stay
Admission: RE | Admit: 2024-04-11 | Discharge: 2024-04-11 | Disposition: A | Source: Ambulatory Visit | Attending: Internal Medicine | Admitting: Internal Medicine

## 2024-04-11 DIAGNOSIS — G959 Disease of spinal cord, unspecified: Secondary | ICD-10-CM

## 2024-04-11 NOTE — Telephone Encounter (Signed)
 Burnard from Dr. Dianne office is calling to let our office know that the patient's MRI results are in his chart.  [phone]

## 2024-05-16 ENCOUNTER — Ambulatory Visit: Admitting: Orthopedic Surgery
